# Patient Record
Sex: Female | Born: 1995 | Race: White | Hispanic: No | Marital: Single
Health system: Southern US, Community
[De-identification: ages and names within clinical notes are randomized; demographics above are authoritative.]

## PROBLEM LIST (undated history)

## (undated) ENCOUNTER — Inpatient Hospital Stay: Payer: Self-pay

## (undated) VITALS — BP 123/87 | HR 88 | Temp 97.4°F | Resp 16 | Ht 64.57 in | Wt 157.2 lb

## (undated) DIAGNOSIS — F419 Anxiety disorder, unspecified: Secondary | ICD-10-CM

## (undated) DIAGNOSIS — T7840XA Allergy, unspecified, initial encounter: Secondary | ICD-10-CM

---
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
## 2019-12-23 ENCOUNTER — Emergency Department (HOSPITAL_COMMUNITY)
Admission: EM | Admit: 2019-12-23 | Discharge: 2019-12-23 | Disposition: A | Discharge status: Home / Self Care | Payer: Self-pay | Attending: Emergency Medicine | Admitting: Emergency Medicine

## 2019-12-23 ENCOUNTER — Emergency Department (HOSPITAL_COMMUNITY): Payer: Self-pay

## 2019-12-23 ENCOUNTER — Other Ambulatory Visit: Payer: Self-pay

## 2019-12-23 ENCOUNTER — Encounter (HOSPITAL_COMMUNITY): Payer: Self-pay | Admitting: Emergency Medicine

## 2019-12-23 DIAGNOSIS — R112 Nausea with vomiting, unspecified: Secondary | ICD-10-CM | POA: Insufficient documentation

## 2019-12-23 DIAGNOSIS — Z20828 Contact with and (suspected) exposure to other viral communicable diseases: Secondary | ICD-10-CM | POA: Insufficient documentation

## 2019-12-23 DIAGNOSIS — Z79899 Other long term (current) drug therapy: Secondary | ICD-10-CM | POA: Insufficient documentation

## 2019-12-23 DIAGNOSIS — F319 Bipolar disorder, unspecified: Secondary | ICD-10-CM | POA: Insufficient documentation

## 2019-12-23 DIAGNOSIS — K7689 Other specified diseases of liver: Secondary | ICD-10-CM | POA: Insufficient documentation

## 2019-12-23 DIAGNOSIS — R1011 Right upper quadrant pain: Secondary | ICD-10-CM | POA: Insufficient documentation

## 2019-12-23 LAB — CBC
HCT: 48.1 % — ABNORMAL HIGH (ref 36.0–46.0)
Hemoglobin: 16.1 g/dL — ABNORMAL HIGH (ref 12.0–15.0)
MCH: 30.4 pg (ref 26.0–34.0)
MCHC: 33.5 g/dL (ref 30.0–36.0)
MCV: 90.9 fL (ref 80.0–100.0)
Platelets: 322 10*3/uL (ref 150–400)
RBC: 5.29 MIL/uL — ABNORMAL HIGH (ref 3.87–5.11)
RDW: 12.3 % (ref 11.5–15.5)
WBC: 9.9 10*3/uL (ref 4.0–10.5)
nRBC: 0 % (ref 0.0–0.2)

## 2019-12-23 LAB — I-STAT BETA HCG BLOOD, ED (MC, WL, AP ONLY): I-stat hCG, quantitative: <5 m[IU]/mL (ref <5)

## 2019-12-23 LAB — COMPREHENSIVE METABOLIC PANEL
ALT: 20 U/L (ref 0–44)
AST: 32 U/L (ref 15–41)
Albumin: 5.3 g/dL — ABNORMAL HIGH (ref 3.5–5.0)
Alkaline Phosphatase: 41 U/L (ref 38–126)
Anion gap: 17 — ABNORMAL HIGH (ref 5–15)
BUN: 9 mg/dL (ref 6–20)
CO2: 16 mmol/L — ABNORMAL LOW (ref 22–32)
Calcium: 10.3 mg/dL (ref 8.9–10.3)
Chloride: 107 mmol/L (ref 98–111)
Creatinine, Ser: 0.76 mg/dL (ref 0.44–1.00)
GFR calc Af Amer: >60 mL/min (ref >60)
GFR calc non Af Amer: >60 mL/min (ref >60)
Glucose, Bld: 98 mg/dL (ref 70–99)
Potassium: 4.2 mmol/L (ref 3.5–5.1)
Sodium: 140 mmol/L (ref 135–145)
Total Bilirubin: 1.6 mg/dL — ABNORMAL HIGH (ref 0.3–1.2)
Total Protein: 8.2 g/dL — ABNORMAL HIGH (ref 6.5–8.1)

## 2019-12-23 LAB — URINALYSIS, ROUTINE W REFLEX MICROSCOPIC
Bilirubin Urine: NEGATIVE
Glucose, UA: NEGATIVE mg/dL
Hgb urine dipstick: NEGATIVE
Ketones, ur: 80 mg/dL — AB
Nitrite: NEGATIVE
Protein, ur: 30 mg/dL — AB
Specific Gravity, Urine: 1.026 (ref 1.005–1.030)
pH: 8 (ref 5.0–8.0)

## 2019-12-23 LAB — LIPASE, BLOOD: Lipase: 30 U/L (ref 11–51)

## 2019-12-23 LAB — SARS CORONAVIRUS 2 (TAT 6-24 HRS): SARS Coronavirus 2: NEGATIVE

## 2019-12-23 LAB — POC SARS CORONAVIRUS 2 AG -  ED: SARS Coronavirus 2 Ag: NEGATIVE

## 2019-12-23 MED ORDER — SODIUM CHLORIDE 0.9 % IV BOLUS
500.0000 mL | Freq: Once | INTRAVENOUS | Status: AC
  Administered 2019-12-23: 500 mL via INTRAVENOUS

## 2019-12-23 MED ORDER — ONDANSETRON HCL 4 MG/2ML IJ SOLN
4.0000 mg | Freq: Once | INTRAMUSCULAR | Status: AC
  Administered 2019-12-23: 4 mg via INTRAVENOUS
  Filled 2019-12-23: qty 2

## 2019-12-23 MED ORDER — MORPHINE SULFATE (PF) 4 MG/ML IV SOLN
4.0000 mg | Freq: Once | INTRAVENOUS | Status: DC
  Filled 2019-12-23: qty 1

## 2019-12-23 MED ORDER — METOCLOPRAMIDE HCL 5 MG/ML IJ SOLN
10.0000 mg | Freq: Once | INTRAMUSCULAR | Status: AC
  Administered 2019-12-23: 10 mg via INTRAVENOUS
  Filled 2019-12-23: qty 2

## 2019-12-23 MED ORDER — ONDANSETRON 4 MG PO TBDP
4.0000 mg | ORAL_TABLET | Freq: Once | ORAL | Status: AC | PRN
  Administered 2019-12-23: 08:00:00 4 mg via ORAL
  Filled 2019-12-23: qty 1

## 2019-12-23 MED ORDER — PROMETHAZINE HCL 25 MG PO TABS: 25.0000 mg | ORAL_TABLET | Freq: Four times a day (QID) | ORAL | 0 refills | Status: DC | PRN

## 2019-12-23 MED ORDER — PROMETHAZINE HCL 25 MG PO TABS: 25.0000 mg | ORAL_TABLET | Freq: Four times a day (QID) | ORAL | 0 refills | Status: AC | PRN

## 2019-12-23 MED ORDER — LIDOCAINE VISCOUS HCL 2 % MT SOLN
15.0000 mL | Freq: Once | OROMUCOSAL | Status: AC
  Administered 2019-12-23: 15 mL via ORAL
  Filled 2019-12-23: qty 15

## 2019-12-23 MED ORDER — ALUM & MAG HYDROXIDE-SIMETH 200-200-20 MG/5ML PO SUSP
30.0000 mL | Freq: Once | ORAL | Status: AC
  Administered 2019-12-23: 30 mL via ORAL
  Filled 2019-12-23: qty 30

## 2019-12-23 NOTE — ED Provider Notes (Signed)
[facility] EMERGENCY DEPARTMENT Provider Note   CSN: [id number] Arrival date & time: 12/23/19  6160     History Chief Complaint  Patient presents with  . Emesis    Krystal Palmer is a 23 y.o. female presents to the ER for evaluation of vomiting.  This began suddenly at 4 AM.  Reports throwing up several times since 4 AM, nonbilious nonbloody.  2 hours after vomiting she developed diffuse upper abdominal cramping that radiates across the top of her abdomen, slightly worse in RUQ and epigastrium.  Pain was initially present with vomiting only but now has became constant, mild, 5/10 and cramping in nature.  She tried to eat a protein bar and Tums but it did not help.  Reports long history of "stomach issues".  States a while ago she started having daily, morning time nausea and vomiting.  She went to a doctor who told her it was stomach acid buildup and recommended pantoprazole and frequent small meals.  She usually wakes up nauseated every day and vomits most mornings.  The symptoms usually improve when she starts to eat.  She was told to follow-up if symptoms not improved but she moved away and did not follow-up.  Initially thought her nausea and vomiting and abdominal pain today was her usual morning time nausea but states this has persisted for several hours.  Did not have dinner last night.  She had associated cold sweats, shaking, headaches as well.  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 Rare, ETOH occasional use.  Daily night time marijuana use.  No abd surgeries.  Denies sick contacts. Denies recent exposure or intake of suspicious foods.   Denies fever, congestion, sore throat, loss of taste/smell, CP, cough, urinary symptoms, diarrhea, constipation.   HPI     Past Medical History:  Diagnosis Date  . Allergy    cat dander  . Anxiety     Patient Active Problem List   Diagnosis Date Noted  . Bipolar affective disorder, mixed,  severe ([place]) 06/09/2013  . ODD (oppositional defiant disorder) 06/09/2013    History reviewed. No pertinent surgical history.   OB History   No obstetric history on file.     Family History  Problem Relation Age of Onset  . Hypertension Father        takes Nitroglycerin PRN  . Bipolar disorder Father     Social History   Tobacco Use  . Smoking status: Not on file  Substance Use Topics  . Alcohol use: No  . Drug use: Yes    Types: Marijuana    Home Medications Prior to Admission medications   Medication Sig Start Date End Date Taking? Authorizing Provider  buPROPion (WELLBUTRIN XL) 300 MG 24 hr tablet Take 1 tablet (300 mg total) by mouth daily with breakfast. 06/15/13   Winson, Manus Rudd, NP  hydrOXYzine (ATARAX/VISTARIL) 50 MG tablet Take 1 tablet (50 mg total) by mouth at bedtime. 06/15/13   Winson, Manus Rudd, NP  lamoTRIgine (LAMICTAL) 200 MG tablet Take 1 tablet (200 mg total) by mouth at bedtime. 06/15/13   Winson, Manus Rudd, NP  promethazine (PHENERGAN) 25 MG tablet Take 1 tablet (25 mg total) by mouth every 6 (six) hours as needed for nausea or vomiting. 12/23/19   Kinnie Feil, PA-C    Allergies    Patient has no known allergies.  Review of Systems   Review of Systems  Constitutional: Positive for chills  and diaphoresis.  Gastrointestinal: Positive for abdominal pain, nausea and vomiting.  Neurological: Positive for headaches.  All other systems reviewed and are negative.   Physical Exam Updated Vital Signs BP 99/63   Pulse 60   Temp 98.3 F (36.8 C) (Oral)   Resp 16   Ht 5\' 4"  (1.626 m)   Wt 59 kg   SpO2 100%   BMI 22.31 kg/m   Physical Exam Vitals and nursing note reviewed.  Constitutional:      Appearance: She is well-developed.     Comments: Non toxic in NAD  HENT:     Head: Normocephalic and atraumatic.     Nose: Nose normal.  Eyes:     Conjunctiva/sclera: Conjunctivae normal.  Cardiovascular:     Rate and Rhythm: Normal rate and regular  rhythm.  Pulmonary:     Effort: Pulmonary effort is normal.     Breath sounds: Normal breath sounds.  Abdominal:     General: Bowel sounds are normal.     Palpations: Abdomen is soft.     Tenderness: There is abdominal tenderness in the right upper quadrant, epigastric area and left upper quadrant.     Comments: No G/R/R. No suprapubic or CVA tenderness. Negative Murphy's and McBurney's. Active BS to lower quadrants.   Musculoskeletal:        General: Normal range of motion.     Cervical back: Normal range of motion.  Skin:    General: Skin is warm and dry.     Capillary Refill: Capillary refill takes less than 2 seconds.  Neurological:     Mental Status: She is alert.  Psychiatric:        Behavior: Behavior normal.     ED Results / Procedures / Treatments   Labs (all labs ordered are listed, but only abnormal results are displayed) Labs Reviewed  COMPREHENSIVE METABOLIC PANEL - Abnormal; Notable for the following components:      Result Value   CO2 16 (*)    Total Protein 8.2 (*)    Albumin 5.3 (*)    Total Bilirubin 1.6 (*)    Anion gap 17 (*)    All other components within normal limits  CBC - Abnormal; Notable for the following components:   RBC 5.29 (*)    Hemoglobin 16.1 (*)    HCT 48.1 (*)    All other components within normal limits  URINALYSIS, ROUTINE W REFLEX MICROSCOPIC - Abnormal; Notable for the following components:   APPearance CLOUDY (*)    Ketones, ur 80 (*)    Protein, ur 30 (*)    Leukocytes,Ua TRACE (*)    Bacteria, UA RARE (*)    All other components within normal limits  SARS CORONAVIRUS 2 (TAT 6-24 HRS)  LIPASE, BLOOD  I-STAT BETA HCG BLOOD, ED ([facility], [facility], [facility] ONLY)  POC SARS CORONAVIRUS 2 AG -  ED    EKG None  Radiology Abdomen Limited RUQ  Result Date: 12/23/2019 CLINICAL DATA:  Right upper quadrant pain EXAM: ULTRASOUND ABDOMEN LIMITED RIGHT UPPER QUADRANT COMPARISON:  None. FINDINGS: Gallbladder: No gallstones or wall thickening  visualized. There is no pericholecystic fluid. No sonographic Murphy sign noted by sonographer. Common bile duct: Diameter: 2 mm. No intrahepatic or extrahepatic biliary duct dilatation. Liver: There is a 1.0 x 0.9 x 0.6 cm cyst in the right lobe of the liver near the portal vein. Within normal limits in parenchymal echogenicity. Portal vein is patent on color Doppler imaging with normal direction  of blood flow towards the liver. Other: None. IMPRESSION: Small cyst in right lobe of liver.  Study otherwise unremarkable. Electronically Signed   By: Bretta BangWilliam  Woodruff III M.D.   On: 12/23/2019 13:43    Procedures Procedures (including critical care time)  Medications Ordered in ED Medications  morphine 4 MG/ML injection 4 mg (4 mg Intravenous Not Given 12/23/19 1227)  ondansetron (ZOFRAN-ODT) disintegrating tablet 4 mg (4 mg Oral Given 12/23/19 0806)  ondansetron (ZOFRAN) injection 4 mg (4 mg Intravenous Given 12/23/19 0838)  metoCLOPramide (REGLAN) injection 10 mg (10 mg Intravenous Given 12/23/19 1229)  alum & mag hydroxide-simeth (MAALOX/MYLANTA) 200-200-20 MG/5ML suspension 30 mL (30 mLs Oral Given 12/23/19 1227)    And  lidocaine (XYLOCAINE) 2 % viscous mouth solution 15 mL (15 mLs Oral Given 12/23/19 1227)  sodium chloride 0.9 % bolus 500 mL (0 mLs Intravenous Stopped 12/23/19 1422)    ED Course  I have reviewed the triage vital signs and the nursing notes.  Pertinent labs & imaging results that were available during my care of the patient were reviewed by me and considered in my medical decision making (see chart for details).  Clinical Course as of Dec 23 1431  Wed Dec 23, 2019  1245 Ketones, ur(!): 80 [CG]  1337 RBC / HPF: 0-5 [CG]    Clinical Course User Index [CG] Liberty HandyGibbons, Digna Countess J, PA-C   MDM Rules/Calculators/A&P                      DDX includes viral gastroenteritis versus gastritis/GERD versus viral syndrome including Covid illness versus marijuana induced hyperemesis  syndrome.  On exam she has very mild generalized upper abdominal tenderness, nonfocal.  Negative Murphy's.  Negative McBurney's.  No suprapubic or CVA tenderness.  Afebrile.  ER work-up initiated in triage personally reviewed and essentially unremarkable.  No leukocytosis.  Electrolytes normal.  Creatinine normal.  LFTs and lipase normal.  UA shows 80 ketones likely from dehydration but no convincing signs of infection, RBCs.  Given generalized upper abdominal pain right upper quadrant sono was obtained and it was nonacute other than incidental liver nodule.  I considered SBO, diverticulitis, appendicitis very unlikely given her exam.  She had GI cocktail, IV fluid, antiemetic and morphine and had almost complete resolution of her symptoms here.  Her pain is upper abdominal, slightly worse in the right upper abdomen but she has no flank tenderness, suprapubic tenderness, urinary symptoms and history of kidney stones.  No RBCs in urine and ureteral stone or GU etiology considered unlikely.  Initial POC Covid is negative, will confirm with send out test.  Recommended cessation of marijuana use, daily PPI, gentle diet, oral hydration, Tylenol for pain.  Recommended follow-up with GI for her ongoing daily nausea, vomiting episodes.  Return precautions discussed.  She is comfortable with this. Final Clinical Impression(s) / ED Diagnoses Final diagnoses:  RUQ abdominal pain  Nausea and vomiting in adult    Rx / DC Orders ED Discharge Orders         Ordered    promethazine (PHENERGAN) 25 MG tablet  Every 6 hours PRN     12/23/19 1432           Jerrell MylarGibbons, Maeson Purohit J, PA-C 12/23/19 1432    Benjiman CorePickering, Nathan, MD 12/23/19 1535

## 2019-12-23 NOTE — ED Triage Notes (Signed)
Pt reports throwing up for 2 hours straight today. Endorses upper mid abd pain, chills and shaking. States she has been seen for abd pain and was told it was GERD.

## 2019-12-23 NOTE — Discharge Instructions (Signed)
You were seen in the ER for upper abdominal pain, nausea and vomiting  Work-up today was normal.  Ultrasound of your gallbladder and liver did not show any acute emergencies or issues, there was an incidental very small nodule on your liver that is likely been there for a long time and will not cause any issues.  You should be aware of this finding and follow-up as needed with your primary care doctor regarding this.  I suspect your symptoms may be from gastritis, acid reflux or possibly ulcer.  Could have also been a virus. All of these are managed similarly. Also, daily marijuana use can cause cannabinoid hyperemesis syndrome characterized by frequent nausea, vomiting, abdominal pain.   We will treat this with anti-acid medicines.  Start taking over the counter omeprazole to 40 mg daily, take on an empty stomach first thing in the morning and wait 20-30 min before eating.  Add famotidine 20 mg in the AM and PM.  Use sucralfate solution 20 min before meals and at bed time. Use phenergan for nausea.   Avoid irritating foods and liquids such as alcohol, greasy/fatty or acidic foods. Avoid ibuprofen containing products.   Follow up with primary care doctor for further management of this if it persits.   Return to the ER for fever, chills, blood in vomit or stool, worsening localized abdominal pain to right upper or right lower abdomen, inability to tolerate fluids.   You were treated for COVID. Rapid COVID screening test today was NEGATIVE.  I recommend isolation until second confirmatory COVID test comes back in the next 24 hours   IF COVID TEST IS POSITIVE Treatment of your illness and symptoms for now will include self-isolation, monitoring of symptoms and supportive care with over-the-counter medicines.    Stay well-hydrated. Rest. You can use over the counter medications to help with symptoms: [phone] acetaminophen (tylenol) every 6 hours, around the clock to help with associated fevers,  sore throat, headaches, generalized body aches and malaise.  Oxymetazoline (afrin) intranasal spray once daily for no more than 3 days to help with congestion, after 3 days you can switch to another over-the-counter nasal steroid spray such as fluticasone (flonase) Allergy medication (loratadine, cetirizine, etc) and phenylephrine (sudafed) help with nasal congestion, runny nose and postnasal drip.   Dextromethorphan (Delsym) to suppress dry cough. Frequent coughing is likely causing your chest wall pain Guaifenesin (Mucinex) to help expectorate mucus and cough Wash your hands often to prevent spread.  Stay hydrated with plenty of clear fluids Rest   Return to the ED if symptoms are worsening or severe, there is increased work of breathing, chest pain or shortness of breath with exertion or activity, inability to tolerate fluids due to persistent vomiting despite nausea medicines, passing out, light headedness.  If your test results are POSITIVE, the following isolation requirements need to be met to return to work and resume essential activities: At least 10 days since symptom onset  72 hours of absence of fever without antifever medicine (ibuprofen, acetaminophen). A fever is temperature of 100.53F or greater. Improvement of respiratory symptoms  If your test is NEGATIVE, the following requirements need to be met to return to work and essential activities: 1. Your symptoms have improved  2.  You do not have a fever for a total of 3 days without antifever medicines (ibuprofen, acetaminophen).   3. You do not develop any other symptoms of COVID-19.  Call your job and notify them that your test result was  negative to see if they will all you to return to work. Sometimes when a COVID test is done very early on in the illness, it can be false negative.  This means the test did not pick up the virus because it was too early.  If your symptoms are not improving or you develop new symptoms of COVID-19,  you may need to be retested.      Infection Prevention Recommendations for Individuals Confirmed to have, or Being Evaluated for, or have symptoms of 2019 Novel Coronavirus (COVID-19) Infection Who Receive Care at Home  Individuals who are confirmed to have, or are being evaluated for, COVID-19 should follow the prevention steps below until a healthcare provider or local or state health department says they can return to normal activities.  Stay home except to get medical care You should restrict activities outside your home, except for getting medical care. Do not go to work, school, or public areas, and do not use public transportation or taxis.  Call ahead before visiting your doctor Before your medical appointment, call the healthcare provider and tell them that you have, or are being evaluated for, COVID-19 infection. This will help the healthcare provider's office take steps to keep other people from getting infected. Ask your healthcare provider to call the local or state health department.  Monitor your symptoms Seek prompt medical attention if your illness is worsening (e.g., difficulty breathing). Before going to your medical appointment, call the healthcare provider and tell them that you have, or are being evaluated for, COVID-19 infection. Ask your healthcare provider to call the local or state health department.  Wear a facemask You should wear a facemask that covers your nose and mouth when you are in the same room with other people and when you visit a healthcare provider. People who live with or visit you should also wear a facemask while they are in the same room with you.  Separate yourself from other people in your home As much as possible, you should stay in a different room from other people in your home. Also, you should use a separate bathroom, if available.  Avoid sharing household items You should not share dishes, drinking glasses, cups, eating utensils, towels,  bedding, or other items with other people in your home. After using these items, you should wash them thoroughly with soap and water.  Cover your coughs and sneezes Cover your mouth and nose with a tissue when you cough or sneeze, or you can cough or sneeze into your sleeve. Throw used tissues in a lined trash can, and immediately wash your hands with soap and water for at least 20 seconds or use an alcohol-based hand rub.  Wash your [facility] your hands often and thoroughly with soap and water for at least 20 seconds. You can use an alcohol-based hand sanitizer if soap and water are not available and if your hands are not visibly dirty. Avoid touching your eyes, nose, and mouth with unwashed hands.   Prevention Steps for Caregivers and Household Members of Individuals Confirmed to have, or Being Evaluated for, or have symptoms of 2019 Novel Coronavirus (COVID-19) Infection Being Cared for in the Home  If you live with, or provide care at home for, a person confirmed to have, or being evaluated for, COVID-19 infection please follow these guidelines to prevent infection:  Follow healthcare provider's instructions Make sure that you understand and can help the patient follow any healthcare provider instructions for all care.  Provide  for the patient's basic needs You should help the patient with basic needs in the home and provide support for getting groceries, prescriptions, and other personal needs.  Monitor the patient's symptoms If they are getting sicker, call his or her medical provider and tell them that the patient has, or is being evaluated for, COVID-19 infection. This will help the healthcare provider's office take steps to keep other people from getting infected. Ask the healthcare provider to call the local or state health department.  Limit the number of people who have contact with the patient If possible, have only one caregiver for the patient. Other household members  should stay in another home or place of residence. If this is not possible, they should stay in another room, or be separated from the patient as much as possible. Use a separate bathroom, if available. Restrict visitors who do not have an essential need to be in the home.  Keep older adults, very young children, and other sick people away from the patient Keep older adults, very young children, and those who have compromised immune systems or chronic health conditions away from the patient. This includes people with chronic heart, lung, or kidney conditions, diabetes, and cancer.  Ensure good ventilation Make sure that shared spaces in the home have good air flow, such as from an air conditioner or an opened window, weather permitting.  Wash your hands often Wash your hands often and thoroughly with soap and water for at least 20 seconds. You can use an alcohol based hand sanitizer if soap and water are not available and if your hands are not visibly dirty. Avoid touching your eyes, nose, and mouth with unwashed hands. Use disposable paper towels to dry your hands. If not available, use dedicated cloth towels and replace them when they become wet.  Wear a facemask and gloves Wear a disposable facemask at all times in the room and gloves when you touch or have contact with the patient's blood, body fluids, and/or secretions or excretions, such as sweat, saliva, sputum, nasal mucus, vomit, urine, or feces.  Ensure the mask fits over your nose and mouth tightly, and do not touch it during use. Throw out disposable facemasks and gloves after using them. Do not reuse. Wash your hands immediately after removing your facemask and gloves. If your personal clothing becomes contaminated, carefully remove clothing and launder. Wash your hands after handling contaminated clothing. Place all used disposable facemasks, gloves, and other waste in a lined container before disposing them with other household  waste. Remove gloves and wash your hands immediately after handling these items.  Do not share dishes, glasses, or other household items with the patient Avoid sharing household items. You should not share dishes, drinking glasses, cups, eating utensils, towels, bedding, or other items with a patient who is confirmed to have, or being evaluated for, COVID-19 infection. After the person uses these items, you should wash them thoroughly with soap and water.  Wash laundry thoroughly Immediately remove and wash clothes or bedding that have blood, body fluids, and/or secretions or excretions, such as sweat, saliva, sputum, nasal mucus, vomit, urine, or feces, on them. Wear gloves when handling laundry from the patient. Read and follow directions on labels of laundry or clothing items and detergent. In general, wash and dry with the warmest temperatures recommended on the label.  Clean all areas the individual has used often Clean all touchable surfaces, such as counters, tabletops, doorknobs, bathroom fixtures, toilets, phones, keyboards, tablets, and  bedside tables, every day. Also, clean any surfaces that may have blood, body fluids, and/or secretions or excretions on them. Wear gloves when cleaning surfaces the patient has come in contact with. Use a diluted bleach solution (e.g., dilute bleach with 1 part bleach and 10 parts water) or a household disinfectant with a label that says EPA-registered for coronaviruses. To make a bleach solution at home, add 1 tablespoon of bleach to 1 quart (4 cups) of water. For a larger supply, add  cup of bleach to 1 gallon (16 cups) of water. Read labels of cleaning products and follow recommendations provided on product labels. Labels contain instructions for safe and effective use of the cleaning product including precautions you should take when applying the product, such as wearing gloves or eye protection and making sure you have good ventilation during use of  the product. Remove gloves and wash hands immediately after cleaning.  Monitor yourself for signs and symptoms of illness Caregivers and household members are considered close contacts, should monitor their health, and will be asked to limit movement outside of the home to the extent possible. Follow the monitoring steps for close contacts listed on the symptom monitoring form.  ? If you have additional questions, contact your local health department or call the epidemiologist on call at [phone] (available 24/7). ? This guidance is subject to change. For the most up-to-date guidance from [facility], please refer to their website: YouBlogs.pl

## 2019-12-23 NOTE — ED Notes (Signed)
Patient transported to Ultrasound 

## 2019-12-23 NOTE — ED Notes (Signed)
Patient verbalizes understanding of discharge instructions. Opportunity for questioning and answers were provided. Pt discharged from ED. 

## 2021-04-04 IMAGING — US US ABDOMEN LIMITED
1 series · 14 of 25 positions shown · non-contrast
Comparison: None.

CLINICAL DATA: Right upper quadrant pain

EXAM:
ULTRASOUND ABDOMEN LIMITED RIGHT UPPER QUADRANT

[Series 1: us abdomen limited · 14 of 42 slices shown]
[im 1/42]
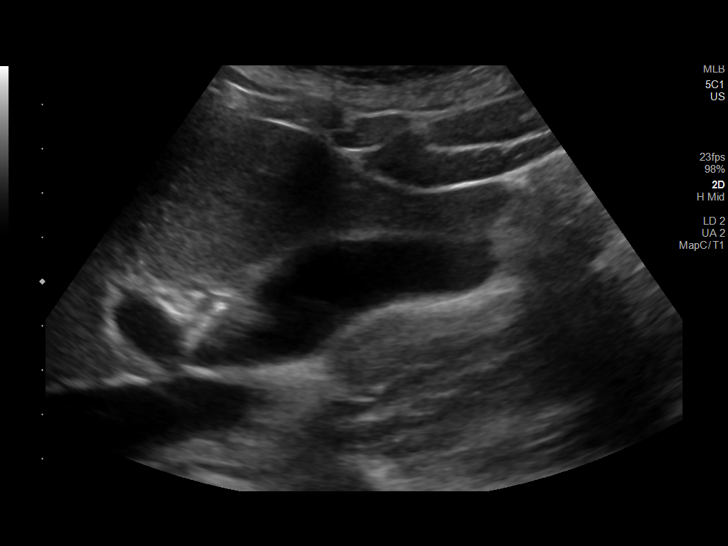
[im 4/42]
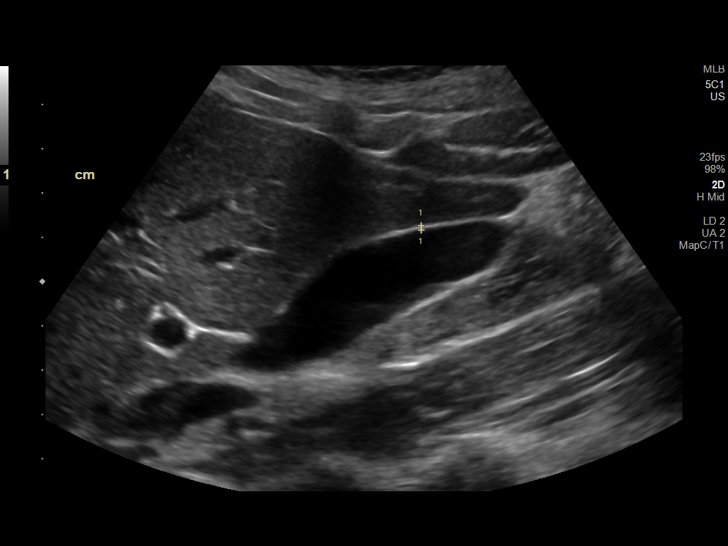
[im 7/42]
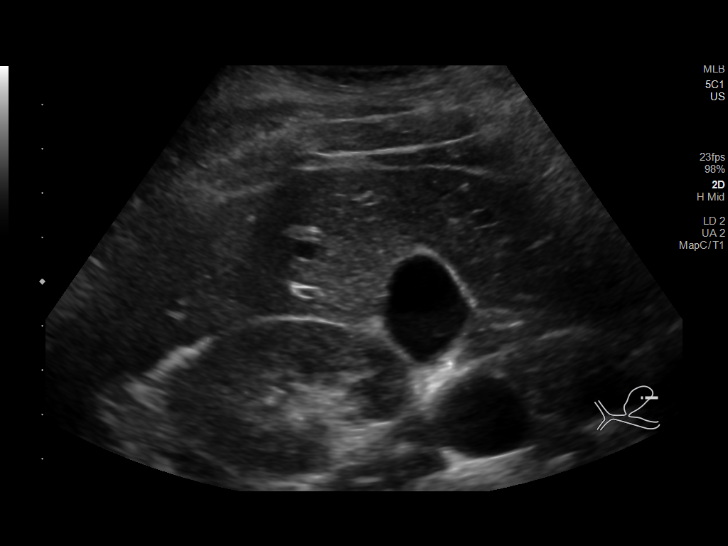
[im 11/42]
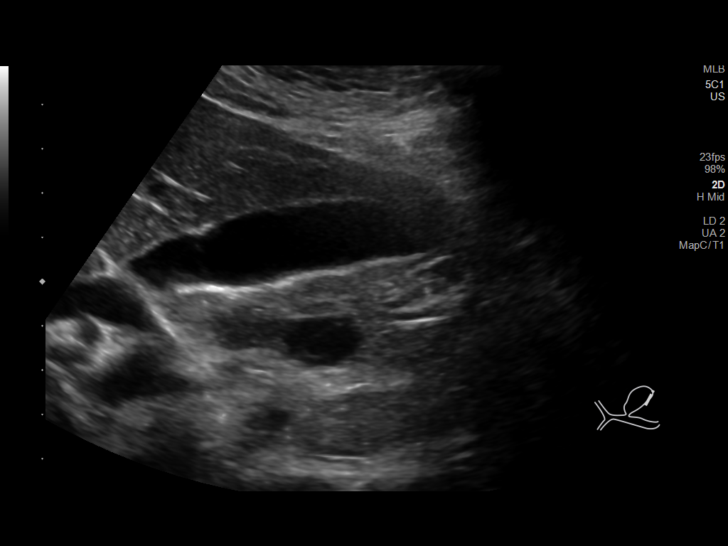
[im 14/42]
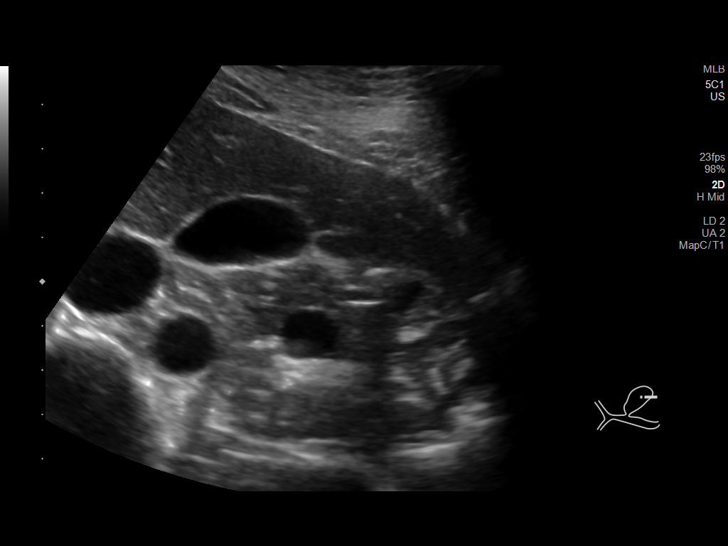
[im 16/42]
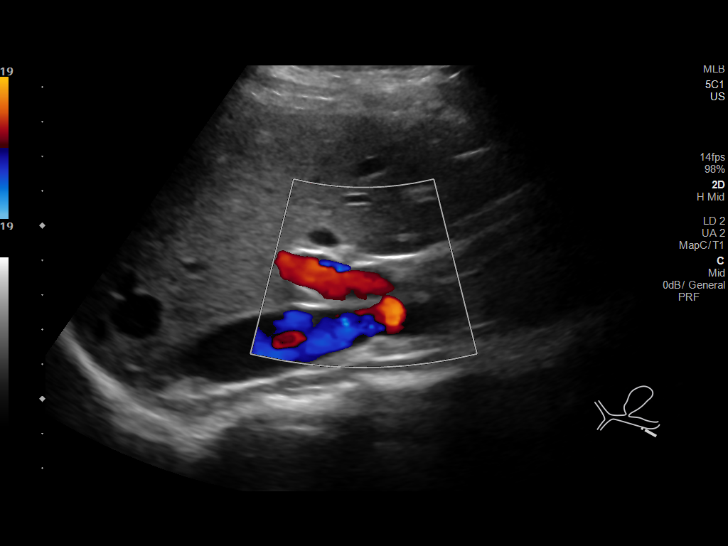
[im 19/42]
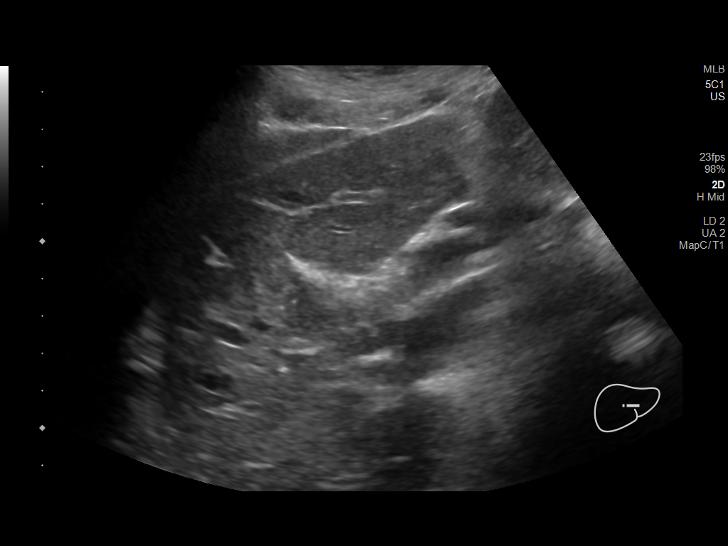
[im 23/42]
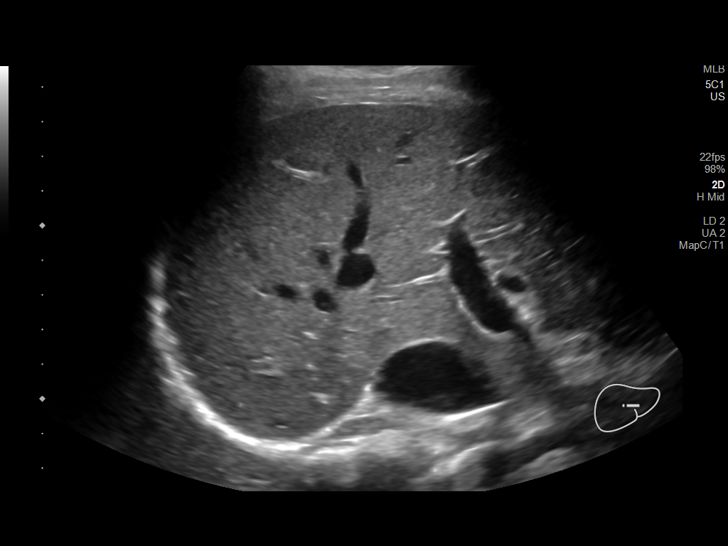
[im 26/42]
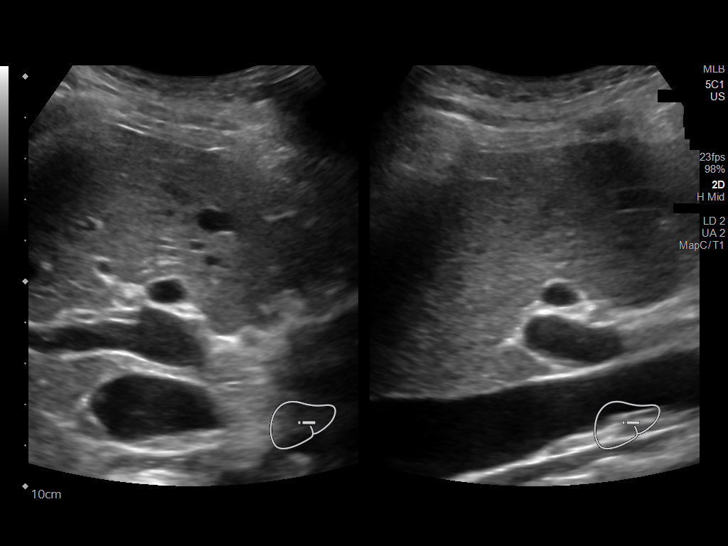
[im 28/42]
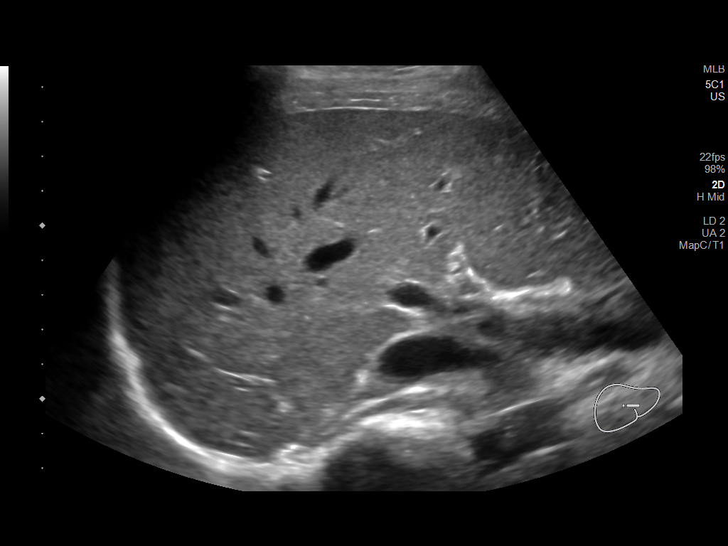
[im 31/42]
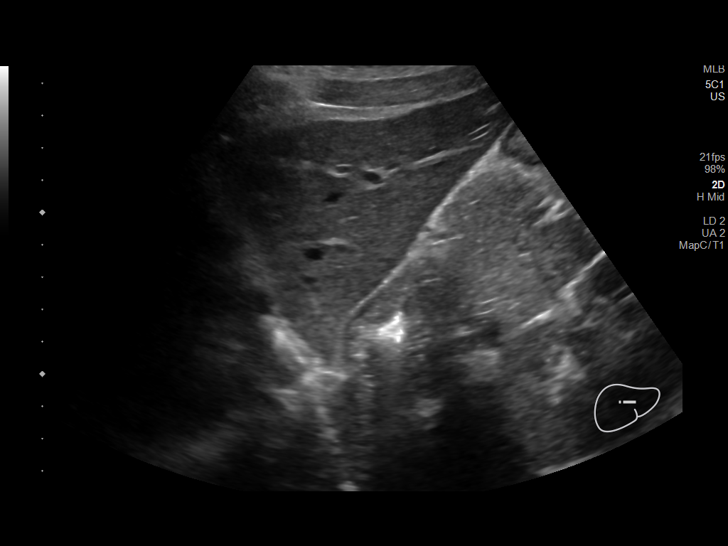
[im 35/42]
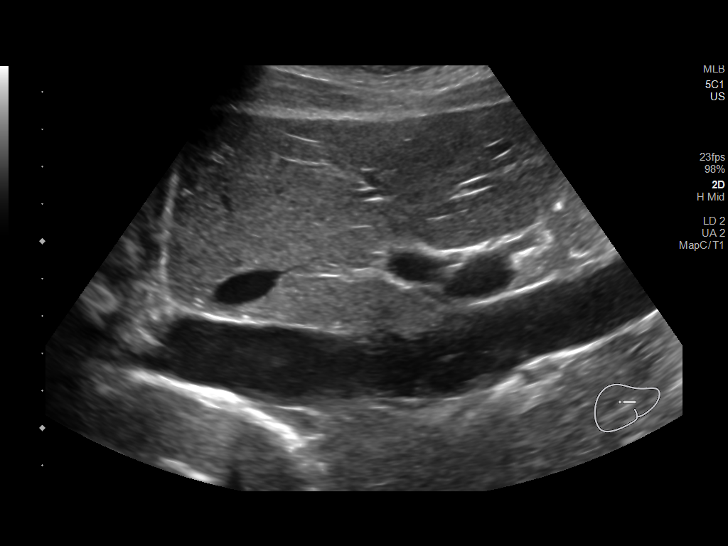
[im 38/42]
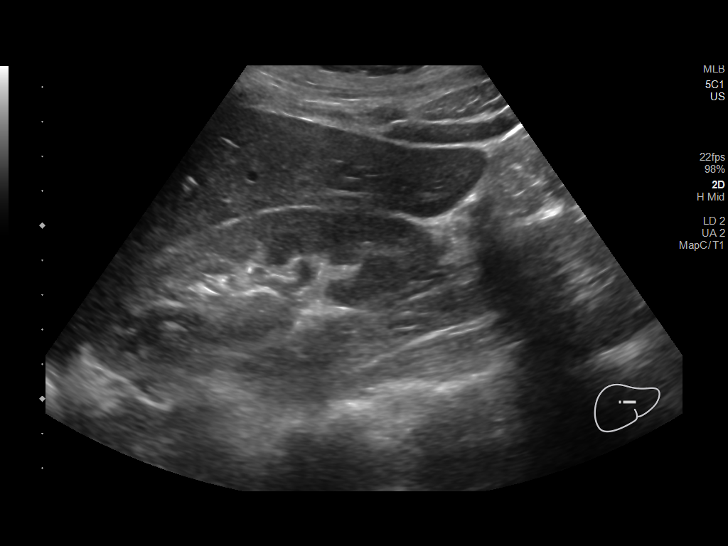
[im 42/42]
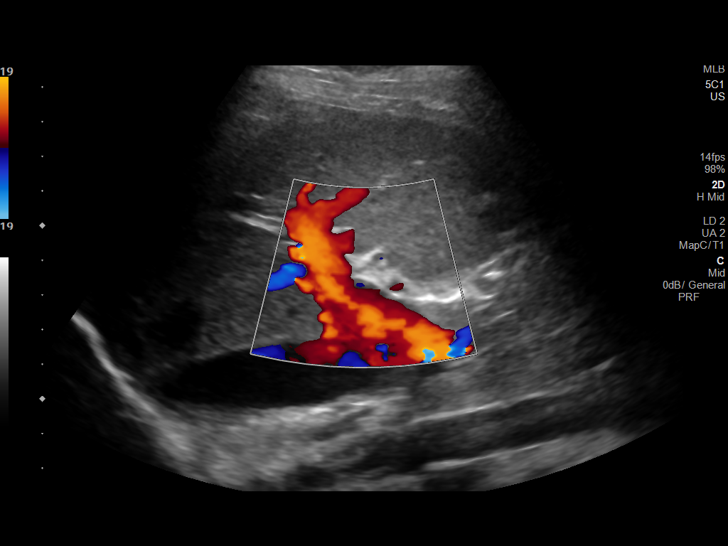

[14 of 25 positions shown; findings below may reference images not displayed]

FINDINGS: Gallbladder:

No gallstones or wall thickening visualized. There is no
pericholecystic fluid. No sonographic Murphy sign noted by
sonographer.

Common bile duct:

Diameter: 2 mm. No intrahepatic or extrahepatic biliary duct
dilatation.

Liver:

There is a 1.0 x 0.9 x 0.6 cm cyst in the right lobe of the liver
near the portal vein. Within normal limits in parenchymal
echogenicity. Portal vein is patent on color Doppler imaging with
normal direction of blood flow towards the liver.

Other: None.
IMPRESSION: Small cyst in right lobe of liver.  Study otherwise unremarkable.
